# Patient Record
Sex: Male | Born: 1975 | Marital: Married
Health system: Southern US, Community
[De-identification: ages and names within clinical notes are randomized; demographics above are authoritative.]

---

## 2006-09-06 ENCOUNTER — Ambulatory Visit: Payer: Self-pay | Admitting: Urology

## 2008-02-03 IMAGING — US US PELVIS LIMITED
1 series · 17 of 25 positions shown · non-contrast
Comparison: none

REASON FOR EXAM: epididymitis testicular pain
COMMENTS:

[Series 1: us pelvis limited · 17 of 46 slices shown]
[im 1/46]
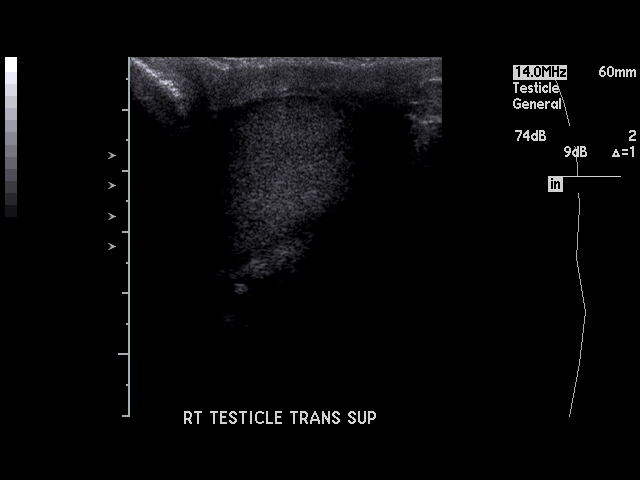
[im 4/46]
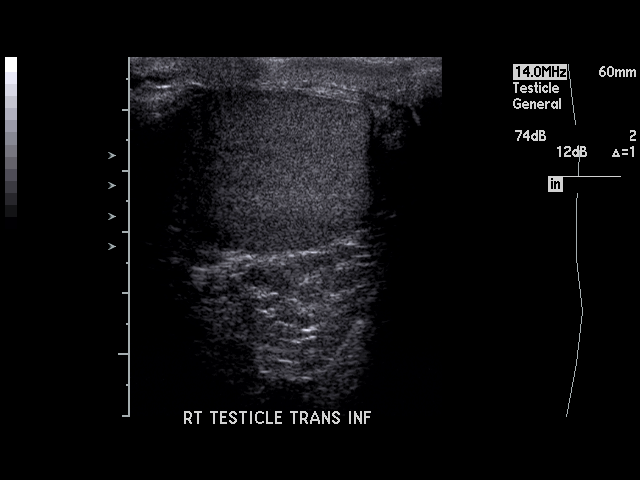
[im 6/46]
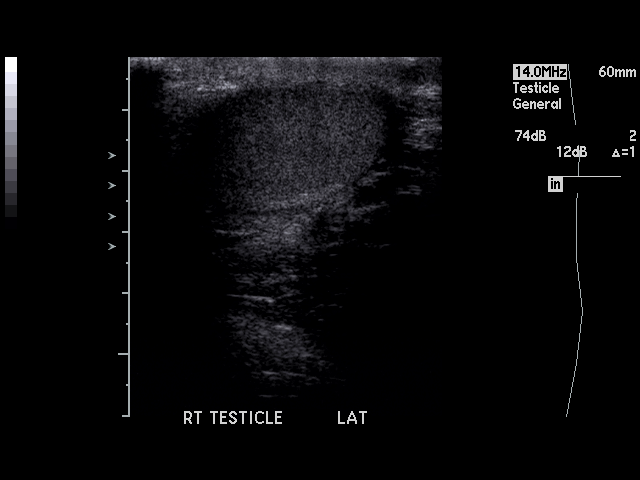
[im 10/46]
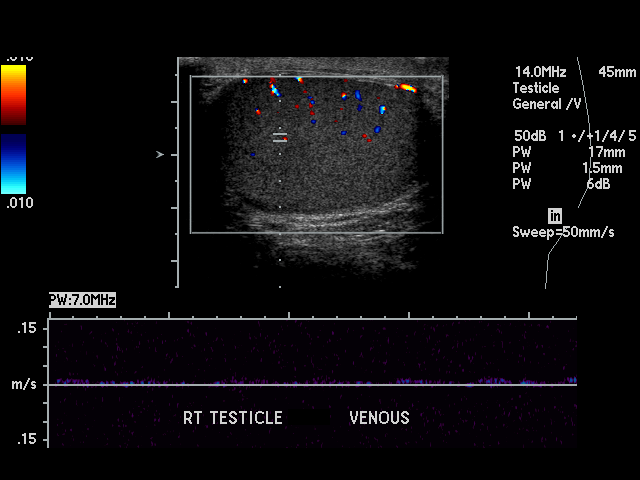
[im 12/46]
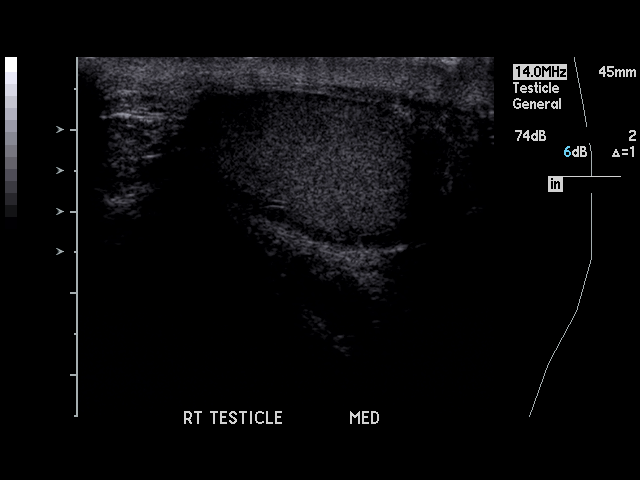
[im 16/46]
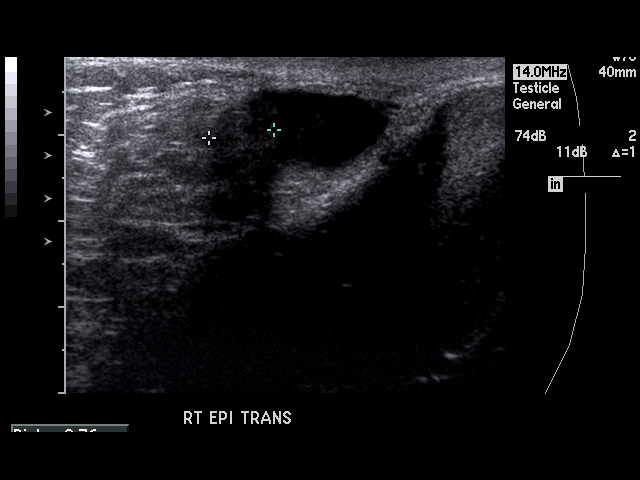
[im 17/46]
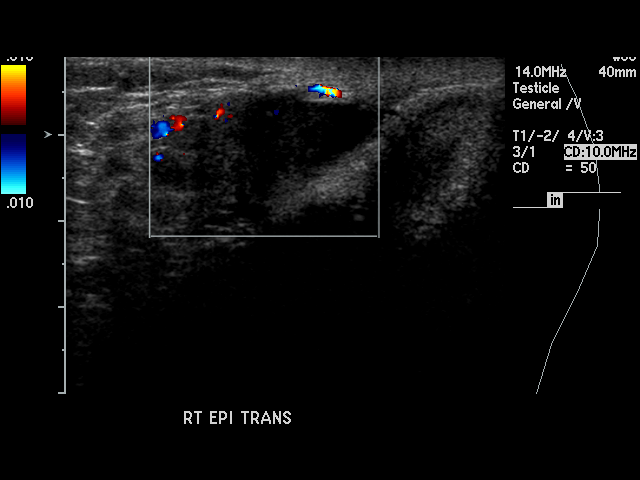
[im 21/46]
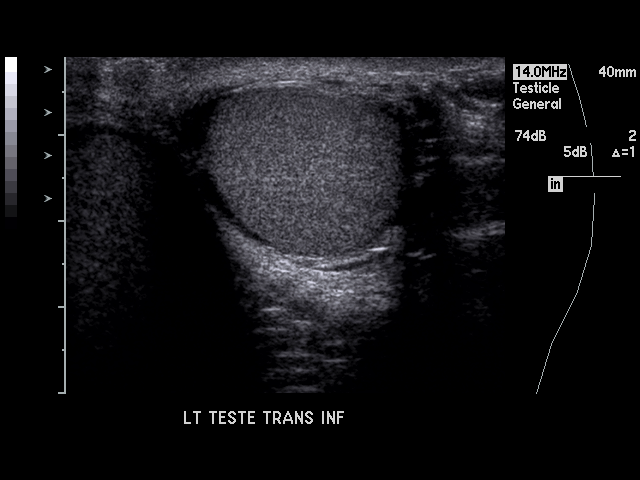
[im 23/46]
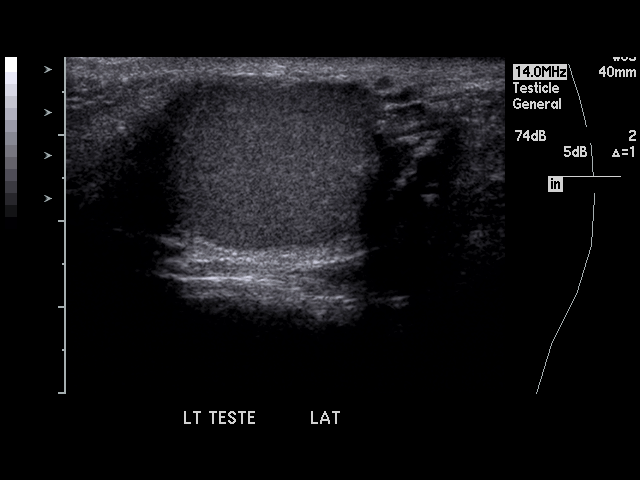
[im 25/46]
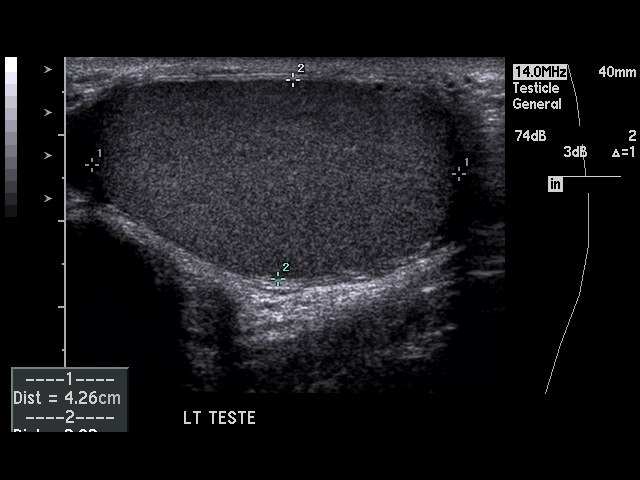
[im 29/46]
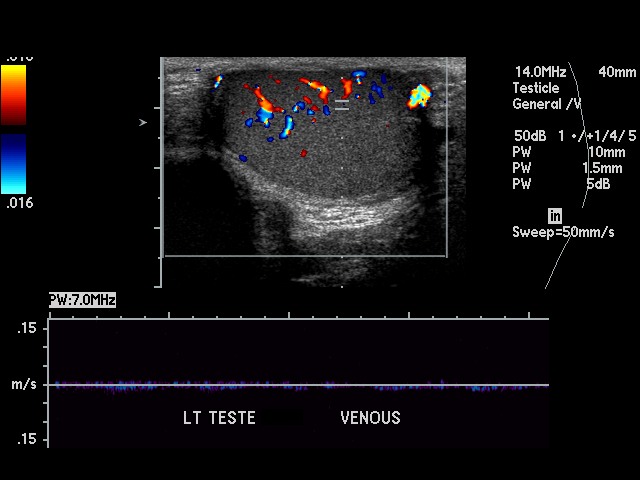
[im 31/46]
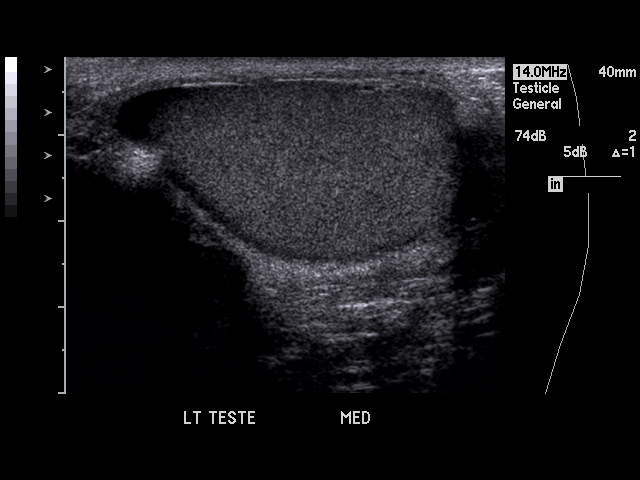
[im 34/46]
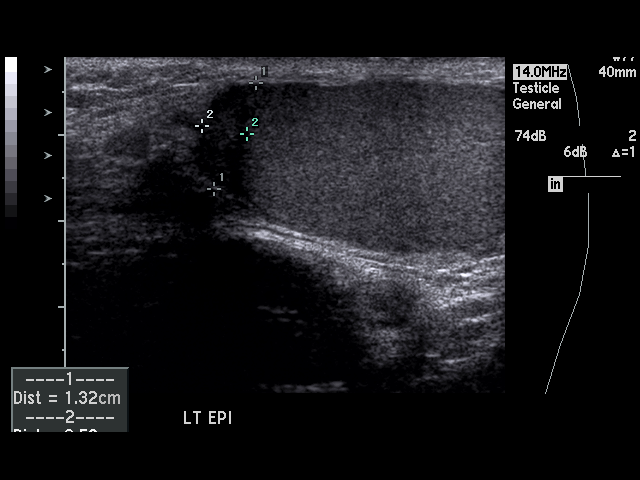
[im 36/46]
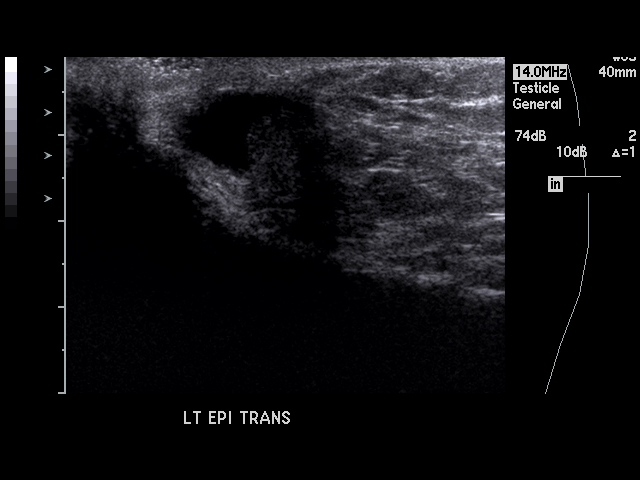
[im 40/46]
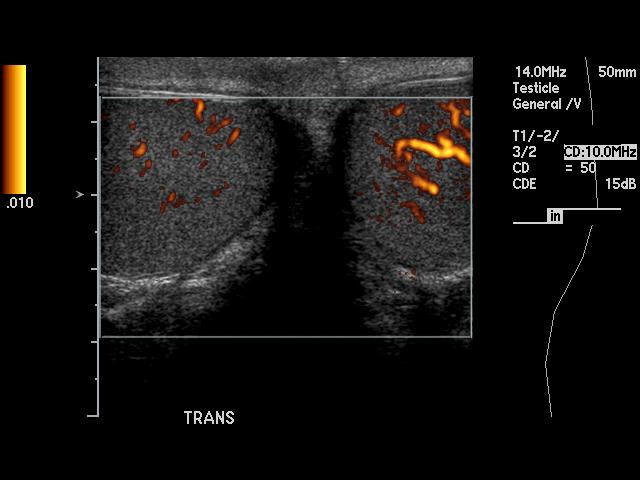
[im 42/46]
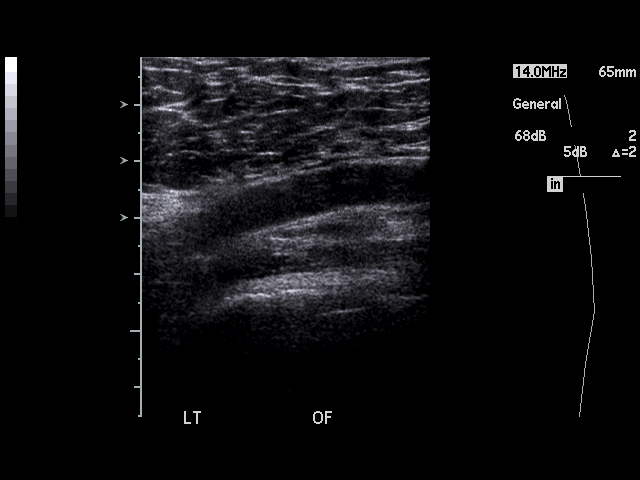
[im 46/46]
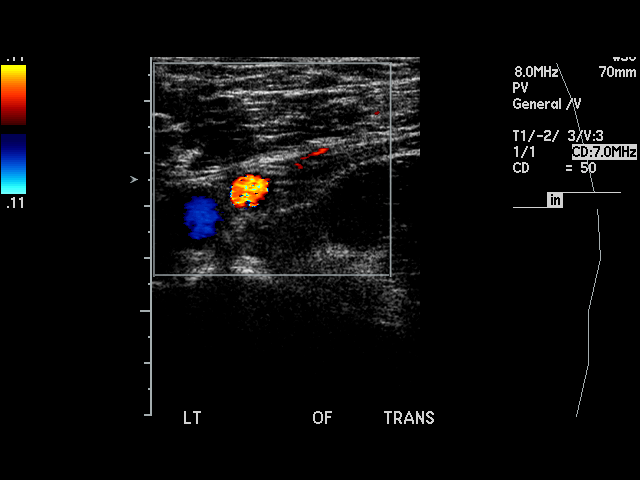

[17 of 25 positions shown; findings below may reference images not displayed]

PROCEDURE:     US  - US TESTICULAR  - September 06, 2006  [DATE]

RESULT:     The right testicle measures 4.26 cm x 3.16 cm x 2.45 cm and the
left testicle measures 4.26 cm x 2.92 cm x 2.32 cm. No intratesticular mass
lesions are seen. No solid extratesticular masses are seen. Tiny hydroceles
are noted bilaterally. The epididymis is normal in size bilaterally. Doppler
examination shows normal vascular flow within each testicle. No evidence for
torsion is seen. There is observed symmetrical flow in the epididymis
bilaterally.
IMPRESSION: 1.     Normal study.

## 2010-11-06 ENCOUNTER — Ambulatory Visit: Payer: Self-pay | Admitting: Family Medicine

## 2010-11-09 ENCOUNTER — Ambulatory Visit: Payer: Self-pay | Admitting: Family Medicine

## 2012-04-07 IMAGING — CR LEFT THIRD TOE
1 series · 3 of 3 positions shown · non-contrast
Comparison: none

REASON FOR EXAM: pain, swelling, s/p trauma
COMMENTS:

PROCEDURE:     MDR - MDR TOE 3RD DIGIT LEFT FOOT  - November 09, 2010  [DATE]
RESULT:     Images of the left third toe demonstrate an oblique fracture in
the proximal phalanx without distraction.

[Series 1: view not recorded · 0.17mm/px · 3 of 3 slices shown]
[im 1/3]
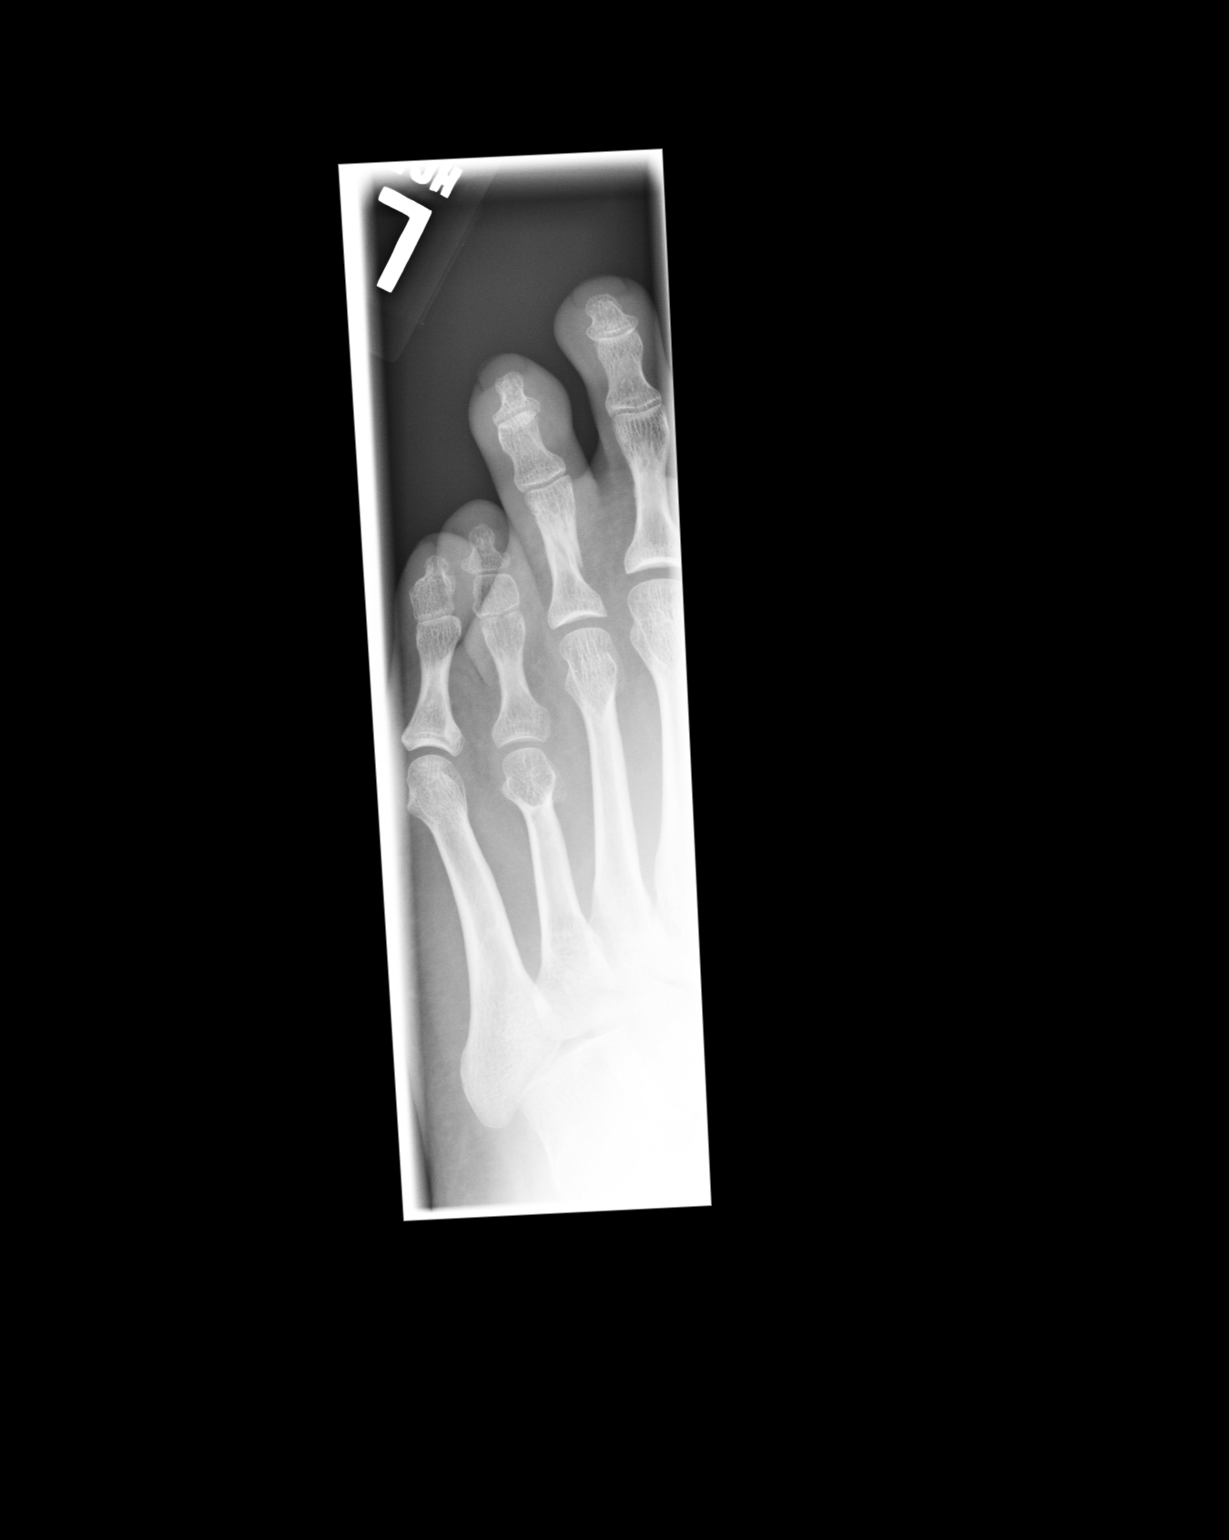
[im 2/3]
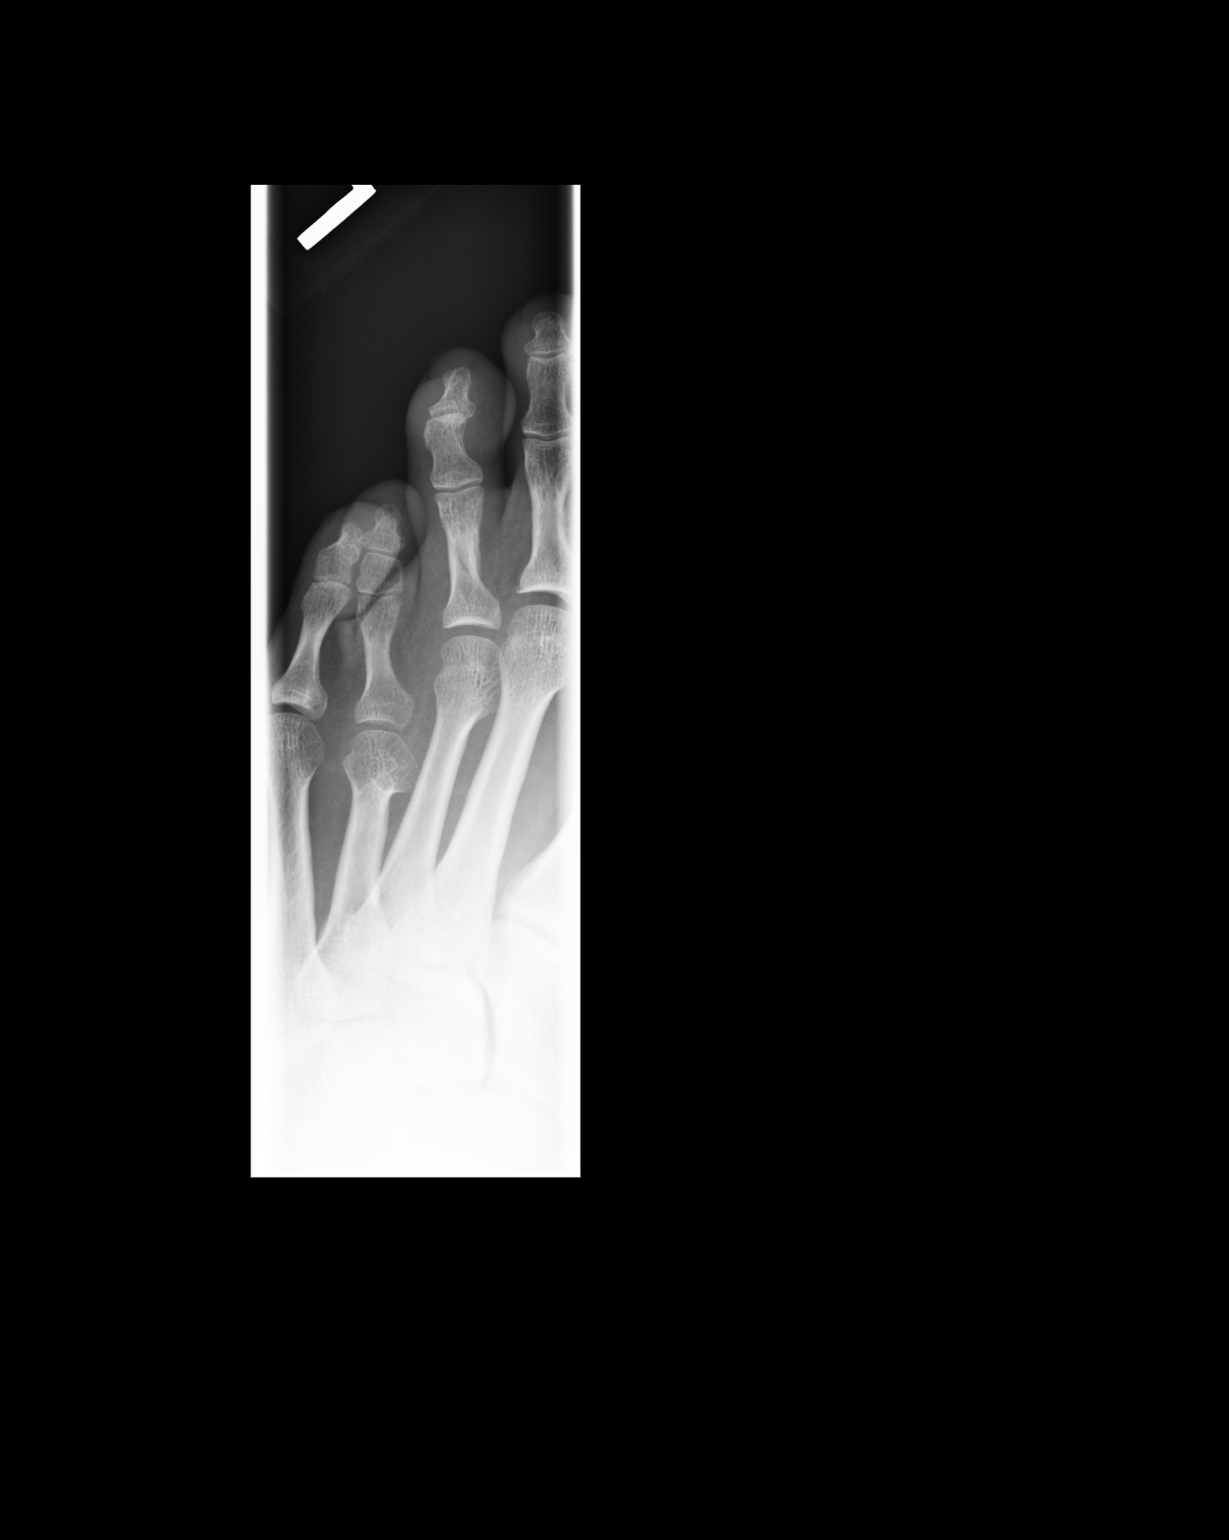
[im 3/3]
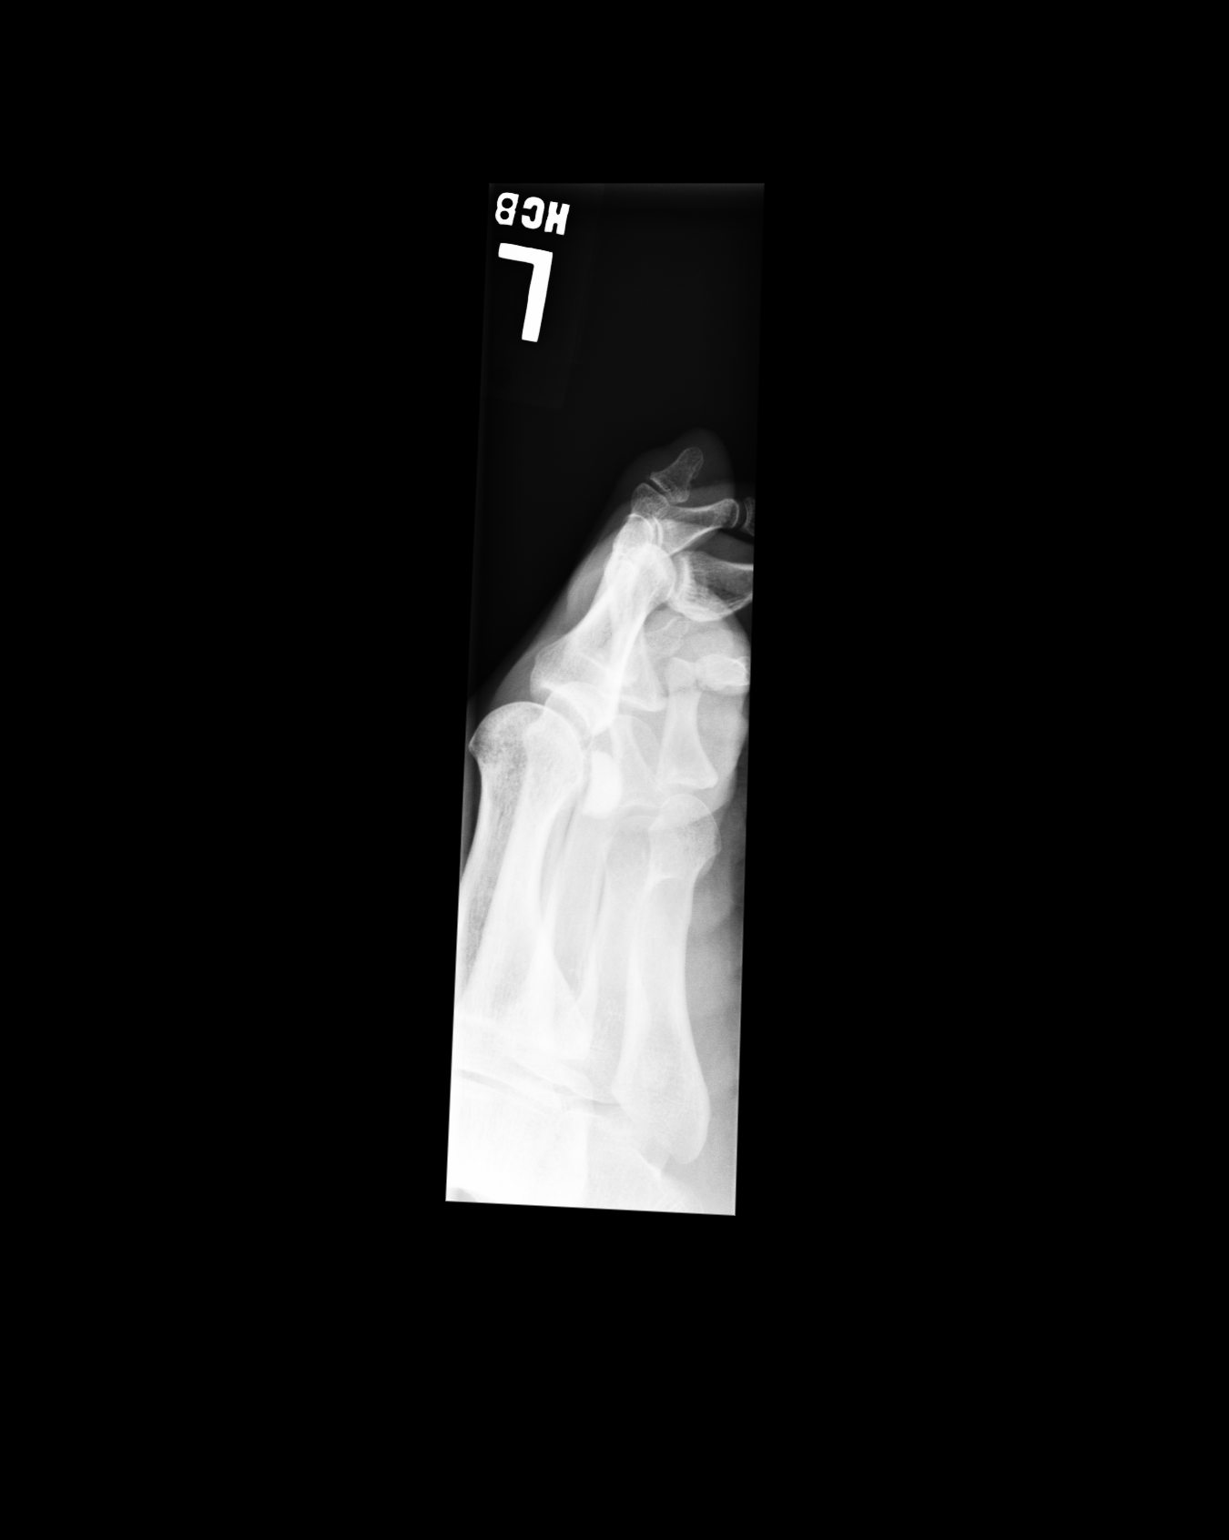

[3 of 3 positions shown; findings below may reference images not displayed]

IMPRESSION: Fracture of the proximal phalanx of the left third toe.

## 2019-02-03 ENCOUNTER — Other Ambulatory Visit: Payer: Self-pay

## 2019-02-03 DIAGNOSIS — Z20822 Contact with and (suspected) exposure to covid-19: Secondary | ICD-10-CM

## 2019-02-04 LAB — NOVEL CORONAVIRUS, NAA: SARS-CoV-2, NAA: NOT DETECTED

## 2019-02-09 ENCOUNTER — Telehealth: Payer: Self-pay | Admitting: General Practice

## 2019-02-09 NOTE — Telephone Encounter (Signed)
Spoke with pt directly. Pt given Covid-19 results

## 2019-07-20 ENCOUNTER — Other Ambulatory Visit: Payer: Self-pay

## 2019-07-20 ENCOUNTER — Ambulatory Visit: Payer: Self-pay | Admitting: *Deleted

## 2019-07-20 NOTE — Telephone Encounter (Signed)
Per initial encounter, "Patient may have been in contact with someone who has covid on Monday and paitent wants to know if its best for him to get covid test today or wait until he is showing symptoms"; the pt stated he had "brief" contact with the person, and they both had on masks; recommendation made to for pt to wait 3-5 days from exposure for testing; pt also informed that it could take up to 14 days for symptoms to evolve, and he should monitor for them; symptoms reviewed; pt also advised to contact his PCP for additional recommendations.  Reason for Disposition . General information question, no triage required and triager able to answer question  Answer Assessment - Initial Assessment Questions 1. REASON FOR CALL or QUESTION: "What is your reason for calling today?" or "How can I best help you?" or "What question do you have that I can help answer?"     COVID testing  Protocols used: INFORMATION ONLY CALL - NO TRIAGE-A-AH

## 2019-07-21 ENCOUNTER — Ambulatory Visit: Payer: 59 | Attending: Internal Medicine

## 2019-07-21 DIAGNOSIS — Z20822 Contact with and (suspected) exposure to covid-19: Secondary | ICD-10-CM

## 2019-07-23 LAB — NOVEL CORONAVIRUS, NAA: SARS-CoV-2, NAA: NOT DETECTED

## 2019-09-29 ENCOUNTER — Ambulatory Visit: Payer: 59 | Attending: Internal Medicine

## 2019-09-29 DIAGNOSIS — Z23 Encounter for immunization: Secondary | ICD-10-CM

## 2019-09-29 NOTE — Progress Notes (Signed)
   Covid-19 Vaccination Clinic  Name:  Eugene Wade    MRN: [id number] DOB: 06-Dec-1975  09/29/2019  [name] was observed post Covid-19 immunization for 30 minutes based on pre-vaccination screening without incident. He was provided with Vaccine Information Sheet and instruction to access the V-Safe system.   [name] was instructed to call 911 with any severe reactions post vaccine: Marland Kitchen Difficulty breathing  . Swelling of face and throat  . A fast heartbeat  . A bad rash all over body  . Dizziness and weakness   Immunizations Administered    Name Date Dose VIS Date Route   Pfizer COVID-19 Vaccine 09/29/2019  6:01 PM 0.3 mL 06/23/2019 Intramuscular   Manufacturer: [employer], [employer]   Lot: [id number]   NDC: [address]-0

## 2019-10-20 ENCOUNTER — Ambulatory Visit: Payer: 59 | Attending: Internal Medicine

## 2019-10-20 ENCOUNTER — Ambulatory Visit: Payer: 59

## 2019-10-20 DIAGNOSIS — Z23 Encounter for immunization: Secondary | ICD-10-CM

## 2019-10-20 NOTE — Progress Notes (Signed)
   Covid-19 Vaccination Clinic  Name:  Eugene Wade    MRN: [id number] DOB: 24-May-1976  10/20/2019  Eugene Wade was observed post Covid-19 immunization for 30 minutes based on pre-vaccination screening without incident. He was provided with Vaccine Information Sheet and instruction to access the V-Safe system.   Eugene Wade was instructed to call 911 with any severe reactions post vaccine: Marland Kitchen Difficulty breathing  . Swelling of face and throat  . A fast heartbeat  . A bad rash all over body  . Dizziness and weakness   Immunizations Administered    Name Date Dose VIS Date Route   Pfizer COVID-19 Vaccine 10/20/2019 11:52 AM 0.3 mL 06/23/2019 Intramuscular   Manufacturer: [employer], [employer]   Lot: [id number]   NDC: [address]-7
